# Patient Record
Sex: Female | Born: 1965 | Race: White | Hispanic: No | Marital: Married | Smoking: Current every day smoker
Health system: Southern US, Community
[De-identification: ages and names within clinical notes are randomized; demographics above are authoritative.]

## PROBLEM LIST (undated history)

## (undated) DIAGNOSIS — I10 Essential (primary) hypertension: Secondary | ICD-10-CM

## (undated) HISTORY — PX: APPENDECTOMY: SHX54

## (undated) HISTORY — PX: ABDOMINAL HYSTERECTOMY: SHX81

---

## 2020-01-15 ENCOUNTER — Other Ambulatory Visit: Payer: Self-pay

## 2020-01-15 ENCOUNTER — Encounter (HOSPITAL_BASED_OUTPATIENT_CLINIC_OR_DEPARTMENT_OTHER): Payer: Self-pay | Admitting: Emergency Medicine

## 2020-01-15 ENCOUNTER — Emergency Department (HOSPITAL_BASED_OUTPATIENT_CLINIC_OR_DEPARTMENT_OTHER)
Admission: EM | Admit: 2020-01-15 | Discharge: 2020-01-15 | Disposition: A | Discharge status: Home / Self Care | Payer: Federal, State, Local not specified - PPO | Attending: Emergency Medicine | Admitting: Emergency Medicine

## 2020-01-15 ENCOUNTER — Emergency Department (HOSPITAL_BASED_OUTPATIENT_CLINIC_OR_DEPARTMENT_OTHER): Payer: Federal, State, Local not specified - PPO

## 2020-01-15 DIAGNOSIS — W1830XA Fall on same level, unspecified, initial encounter: Secondary | ICD-10-CM | POA: Insufficient documentation

## 2020-01-15 DIAGNOSIS — W19XXXA Unspecified fall, initial encounter: Secondary | ICD-10-CM

## 2020-01-15 DIAGNOSIS — I10 Essential (primary) hypertension: Secondary | ICD-10-CM | POA: Insufficient documentation

## 2020-01-15 DIAGNOSIS — S52502A Unspecified fracture of the lower end of left radius, initial encounter for closed fracture: Secondary | ICD-10-CM | POA: Diagnosis not present

## 2020-01-15 DIAGNOSIS — F17210 Nicotine dependence, cigarettes, uncomplicated: Secondary | ICD-10-CM | POA: Diagnosis not present

## 2020-01-15 DIAGNOSIS — S6992XA Unspecified injury of left wrist, hand and finger(s), initial encounter: Secondary | ICD-10-CM | POA: Diagnosis present

## 2020-01-15 HISTORY — DX: Essential (primary) hypertension: I10

## 2020-01-15 MED ORDER — BUPIVACAINE HCL 0.25 % IJ SOLN
10.0000 mL | Freq: Once | INTRAMUSCULAR | Status: DC
  Filled 2020-01-15: qty 10

## 2020-01-15 MED ORDER — TRAMADOL HCL 50 MG PO TABS: 50.0000 mg | ORAL_TABLET | Freq: Four times a day (QID) | ORAL | 0 refills | Status: AC | PRN

## 2020-01-15 MED ORDER — BUPIVACAINE HCL 0.5 % IJ SOLN: 15.0000 mL | Freq: Once | INTRAMUSCULAR | Status: AC

## 2020-01-15 MED ORDER — BUPIVACAINE HCL 0.5 % IJ SOLN
INTRAMUSCULAR | Status: AC
  Administered 2020-01-15: 15 mL
  Filled 2020-01-15: qty 1

## 2020-01-15 MED ORDER — ONDANSETRON 4 MG PO TBDP: 4.0000 mg | ORAL_TABLET | Freq: Three times a day (TID) | ORAL | 0 refills | Status: AC | PRN

## 2020-01-15 MED ORDER — IBUPROFEN 800 MG PO TABS: 800.0000 mg | ORAL_TABLET | Freq: Three times a day (TID) | ORAL | 0 refills | Status: AC | PRN

## 2020-01-15 MED ORDER — KETOROLAC TROMETHAMINE 30 MG/ML IJ SOLN
30.0000 mg | Freq: Once | INTRAMUSCULAR | Status: AC
  Administered 2020-01-15: 30 mg via INTRAMUSCULAR
  Filled 2020-01-15: qty 1

## 2020-01-15 NOTE — ED Triage Notes (Signed)
Pt states she was standing on a bed trying to turn off the light and fell landing on her left arm  Pt states she broke her left wrist

## 2020-01-15 NOTE — ED Provider Notes (Signed)
Emergency Department Provider Note   I have reviewed the triage vital signs and the nursing notes.   HISTORY  Chief Complaint Arm Injury   HPI Krista Delacruz is a 54 y.o. female with PMH of HTN presents to the emergency department for evaluation after fall with left wrist pain and deformity.  Patient was trying to turn on nothing by standing up on the bed.  She lost her footing and fell landing primarily on her left arm.  No head injury or loss of consciousness.  She is noting pain and swelling to the arm but no laceration or bleeding.  Denies pain in her elbow or shoulder.  No neck or back discomfort.  No pain in the legs.  No head injury or loss of consciousness. No hand/arm numbness.   Past Medical History:  Diagnosis Date  . Hypertension     There are no problems to display for this patient.   Past Surgical History:  Procedure Laterality Date  . ABDOMINAL HYSTERECTOMY    . APPENDECTOMY      Allergies Patient has no known allergies.  Family History  Problem Relation Age of Onset  . Hypertension Other   . Stroke Other   . Heart attack Other     Social History Social History   Tobacco Use  . Smoking status: Current Every Day Smoker    Packs/day: 0.25    Types: Cigarettes  . Smokeless tobacco: Never Used  Vaping Use  . Vaping Use: Never used  Substance Use Topics  . Alcohol use: Yes    Comment: occ  . Drug use: Never    Review of Systems  Constitutional: No fever/chills Eyes: No visual changes. ENT: No sore throat. Cardiovascular: Denies chest pain. Respiratory: Denies shortness of breath. Gastrointestinal: No abdominal pain.  No nausea, no vomiting.  No diarrhea.  No constipation. Genitourinary: Negative for dysuria. Musculoskeletal: Positive left wrist pain/deformity.  Skin: Negative for rash. Neurological: Negative for headaches, focal weakness or numbness.  10-point ROS otherwise  negative.  ____________________________________________   PHYSICAL EXAM:  VITAL SIGNS: ED Triage Vitals  Enc Vitals Group     BP 01/15/20 0207 108/71     Pulse Rate 01/15/20 0207 84     Resp 01/15/20 0207 16     Temp 01/15/20 0207 98.2 F (36.8 C)     Temp Source 01/15/20 0207 Oral     SpO2 01/15/20 0207 99 %     Weight 01/15/20 0203 155 lb (70.3 kg)     Height 01/15/20 0203 5\' 4"  (1.626 m)   Constitutional: Alert and oriented. Well appearing and in no acute distress. Eyes: Conjunctivae are normal.  Head: Atraumatic. Nose: No congestion/rhinnorhea. Mouth/Throat: Mucous membranes are moist.   Neck: No stridor.  No cervical spine tenderness to palpation. Cardiovascular: Normal rate, regular rhythm. Good peripheral circulation. Grossly normal heart sounds.   Respiratory: Normal respiratory effort.  No retractions. Lungs CTAB. Gastrointestinal: Soft and nontender. No distention.  Musculoskeletal: Patient's left forearm and wrist are in a makeshift splint which was removed.  There is bruising and some mild deformity at the distal radius but no laceration or abrasion to suspect open fracture.  No tenderness over the left elbow or shoulder.  Neurologic:  Normal speech and language. Normal sensation in the left hand/forearm.  Skin:  Skin is warm, dry and intact. No rash noted.  ____________________________________________  RADIOLOGY  DG Wrist Complete Left  Result Date: 01/15/2020 CLINICAL DATA:  Fall, left wrist pain EXAM: LEFT  WRIST - COMPLETE 3+ VIEW COMPARISON:  None. FINDINGS: Three view radiograph left wrist demonstrates a a comminuted fracture of the distal left radius. The dominant fracture plane extends transversely across the metaphyseal region with 1/2 shaft with dorsal displacement, moderate dorsal angulation, and impaction of the fracture fragments resulting in dorsal tilt of the a distal radial articular surface, shortening of the distal radius with resultant ulnarl  positive variance of approximately 8 mm, and loss of the normal radial inclination. Additionally, a longitudinal fracture plane extends into the lunate fossa from the dominant fracture plane resulting in articular incongruity involving the lunate fossa of the distal radial articular surface. Radiocarpal articulation is preserved. Associated fracture of the base of the ulnar styloid is identified with minimal displacement of the ulnar styloid. Extensive surrounding soft tissue swelling is noted. IMPRESSION: Comminuted fracture of the distal left radius with override, dorsal displacement and angulation of the distal fracture fragment. There is dorsal tilt, loss of normal radial inclination, and articular incongruity involving the distal radial articular surface. Associated fracture of the base of the ulnar styloid. Electronically Signed   By: Helyn Numbers MD   On: 01/15/2020 03:00    ____________________________________________   PROCEDURES  Procedure(s) performed:   Reduction of fracture  Date/Time: 01/21/2020 10:38 AM Performed by: Maia Plan, MD Authorized by: Maia Plan, MD  Consent: Written consent obtained. Risks and benefits: risks, benefits and alternatives were discussed Consent given by: patient Imaging studies: imaging studies available Required items: required blood products, implants, devices, and special equipment available Patient identity confirmed: verbally with patient and arm band Time out: Immediately prior to procedure a "time out" was called to verify the correct patient, procedure, equipment, support staff and site/side marked as required. Preparation: Patient was prepped and draped in the usual sterile fashion. Local anesthesia used: yes Anesthesia: hematoma block  Anesthesia: Local anesthesia used: yes Local Anesthetic: bupivacaine 0.5% without epinephrine Anesthetic total: 6 mL  Sedation: Patient sedated: no  Patient tolerance: patient tolerated the  procedure well with no immediate complications Comments: After patient's hematoma block and anesthesia to the area achieved the left wrist and hand were distracted and elongated back to length with improved gross appearance. Wrist held in place for splinting.   Marland KitchenSplint Application  Date/Time: 01/21/2020 10:40 AM Performed by: Maia Plan, MD Authorized by: Maia Plan, MD   Consent:    Consent obtained:  Verbal   Consent given by:  Patient   Risks, benefits, and alternatives were discussed: yes     Risks discussed:  Discoloration, numbness, pain and swelling Universal protocol:    Patient identity confirmed:  Verbally with patient Pre-procedure details:    Distal neurologic exam:  Normal   Distal perfusion: distal pulses strong   Procedure details:    Location:  Wrist   Wrist location:  L wrist   Strapping: no     Cast type:  Short arm   Splint type:  Sugar tong   Supplies:  Cotton padding, fiberglass and sling   Attestation: Splint applied and adjusted personally by me   Post-procedure details:    Distal neurologic exam:  Unchanged   Distal perfusion: distal pulses strong     Procedure completion:  Tolerated   Post-procedure imaging: reviewed       ____________________________________________   INITIAL IMPRESSION / ASSESSMENT AND PLAN / ED COURSE  Pertinent labs & imaging results that were available during my care of the patient were reviewed by me and considered  in my medical decision making (see chart for details).   Patient presents to the emergency department with fall and resulting distal radius fracture.  No sign of head injury or midline spine tenderness to prompt additional imaging.  No evidence of open fracture.  After discussion of the imaging patient consented to hematoma block with reduction and splinting.  Patient will need to remain in the splint and follow with hand surgery.  She tolerated the procedure well as described above.  Patient is returning to  [place] and will not be following up locally.  I did provide the contact information for our local hand surgeon on call in case she ends up staying in the area but will call her orthopedic surgeon when she returns to [place] in the next several days.  Discussed management of swelling and pain at home.  Medications provided at discharge to treat pain after [facility] drug database review. Discussed ED return precautions.    ____________________________________________  FINAL CLINICAL IMPRESSION(S) / ED DIAGNOSES  Final diagnoses:  Fall, initial encounter  Closed fracture of distal end of left radius, unspecified fracture morphology, initial encounter     MEDICATIONS GIVEN DURING THIS VISIT:  Medications  ketorolac (TORADOL) 30 MG/ML injection 30 mg (30 mg Intramuscular Given 01/15/20 0315)  bupivacaine (MARCAINE) 0.5 % (with pres) injection 15 mL (15 mLs Infiltration Given 01/15/20 0559)     NEW OUTPATIENT MEDICATIONS STARTED DURING THIS VISIT:  Discharge Medication List as of 01/15/2020  5:37 AM    START taking these medications   Details  ibuprofen (ADVIL) 800 MG tablet Take 1 tablet (800 mg total) by mouth every 8 (eight) hours as needed for moderate pain., Starting Wed 01/15/2020, Normal    ondansetron (ZOFRAN ODT) 4 MG disintegrating tablet Take 1 tablet (4 mg total) by mouth every 8 (eight) hours as needed., Starting Wed 01/15/2020, Normal    traMADol (ULTRAM) 50 MG tablet Take 1 tablet (50 mg total) by mouth every 6 (six) hours as needed for severe pain., Starting Wed 01/15/2020, Normal        Note:  This document was prepared using Dragon voice recognition software and may include unintentional dictation errors.  Alona Bene, MD, [facility] Emergency Medicine    Tatianna Ibbotson, Arlyss Repress, MD 01/21/20 [phone]

## 2020-01-15 NOTE — Discharge Instructions (Signed)
You were seen in the emerge department today with arm injury after a fall.  We were able to reduce your fracture and placed you in a splint.  This needs to stay clean and dry.  Please elevate this above the level of your heart and apply ice to reduce swelling.  You may take the pain medications provided as directed.  Tramadol can cause drowsiness and impair your ability to drive a car.  Do not take this with alcohol.  I have listed the name of the local orthopedic surgeon to follow-up with in the coming week.  Please call if you will be staying in town longer than expected.  Once you return home, please contact an orthopedic surgeon in your area for follow-up as this will likely require ongoing specialist care and possible surgery.

## 2022-03-17 IMAGING — DX DG WRIST COMPLETE 3+V*L*
3 series · 3 of 3 positions shown · non-contrast
Comparison: None.

CLINICAL DATA: Fall, left wrist pain

EXAM:
LEFT WRIST - COMPLETE 3+ VIEW

[wrist ap]
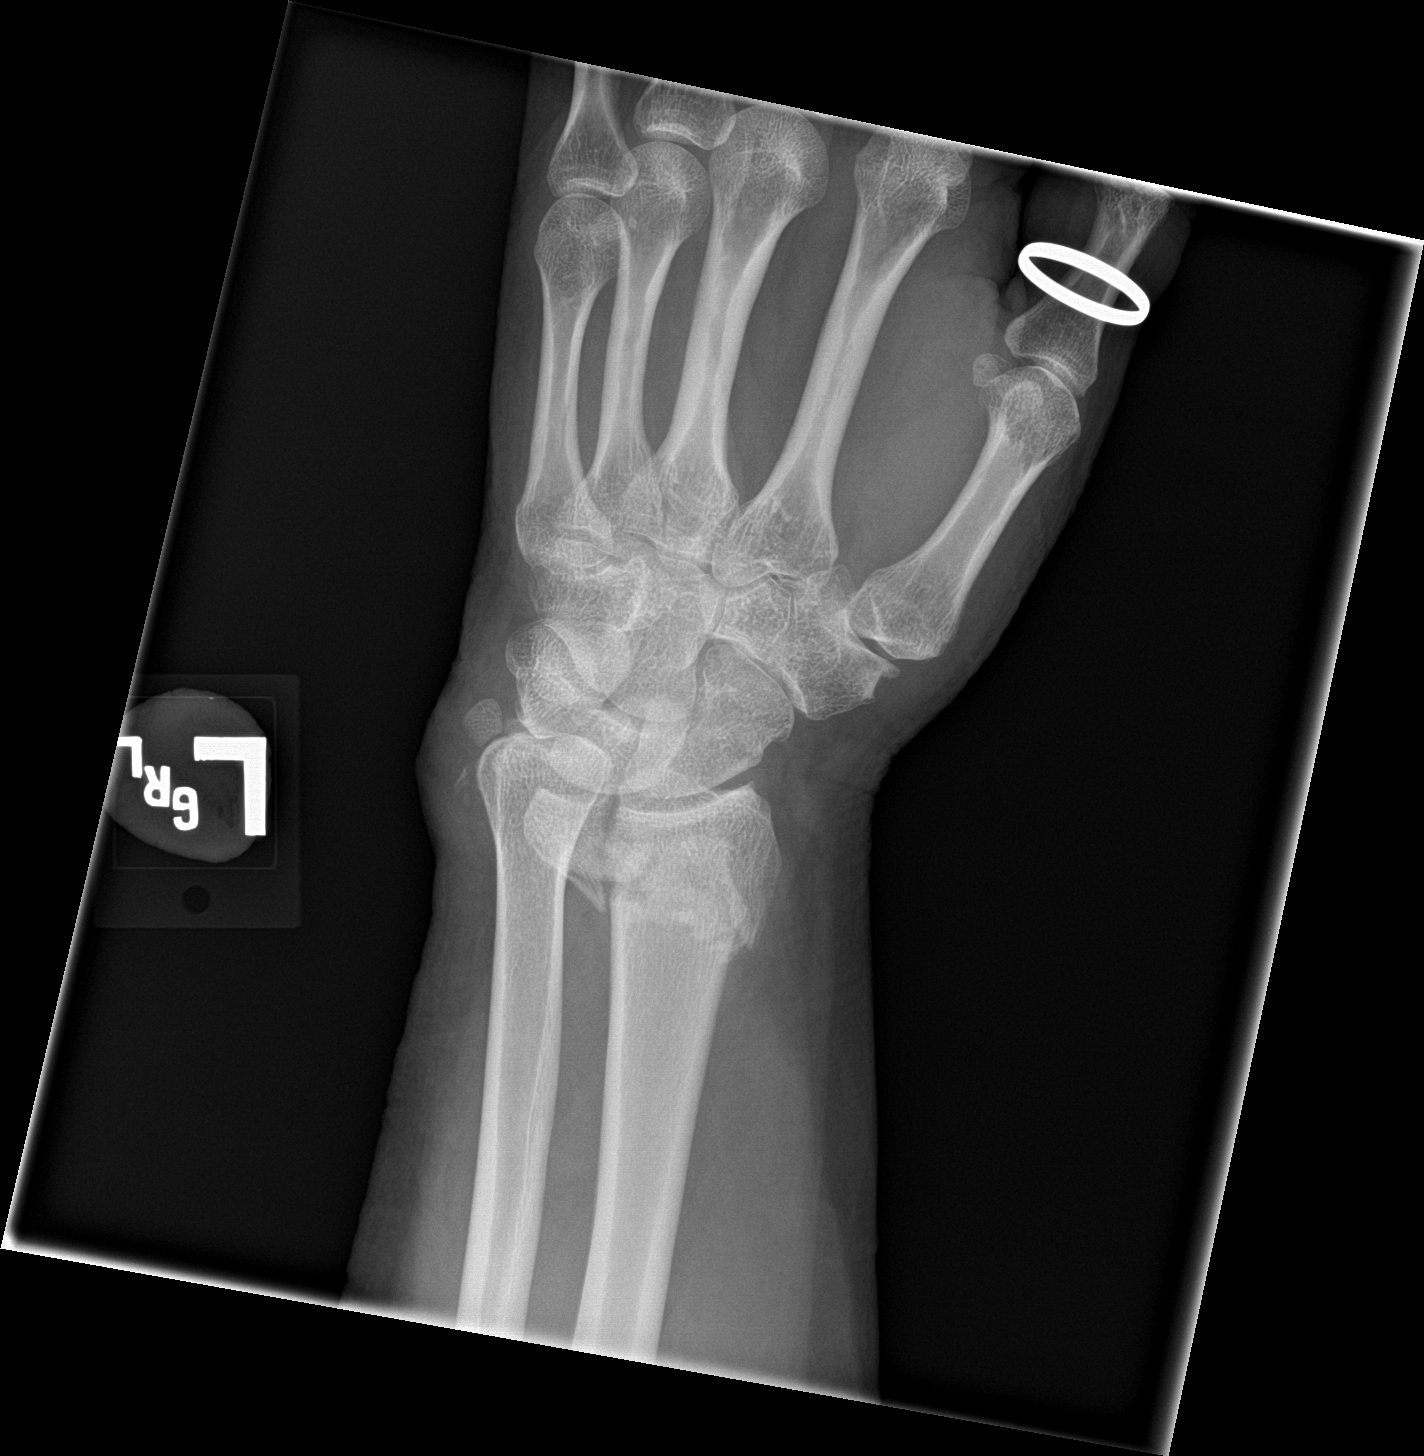

[wrist obl]
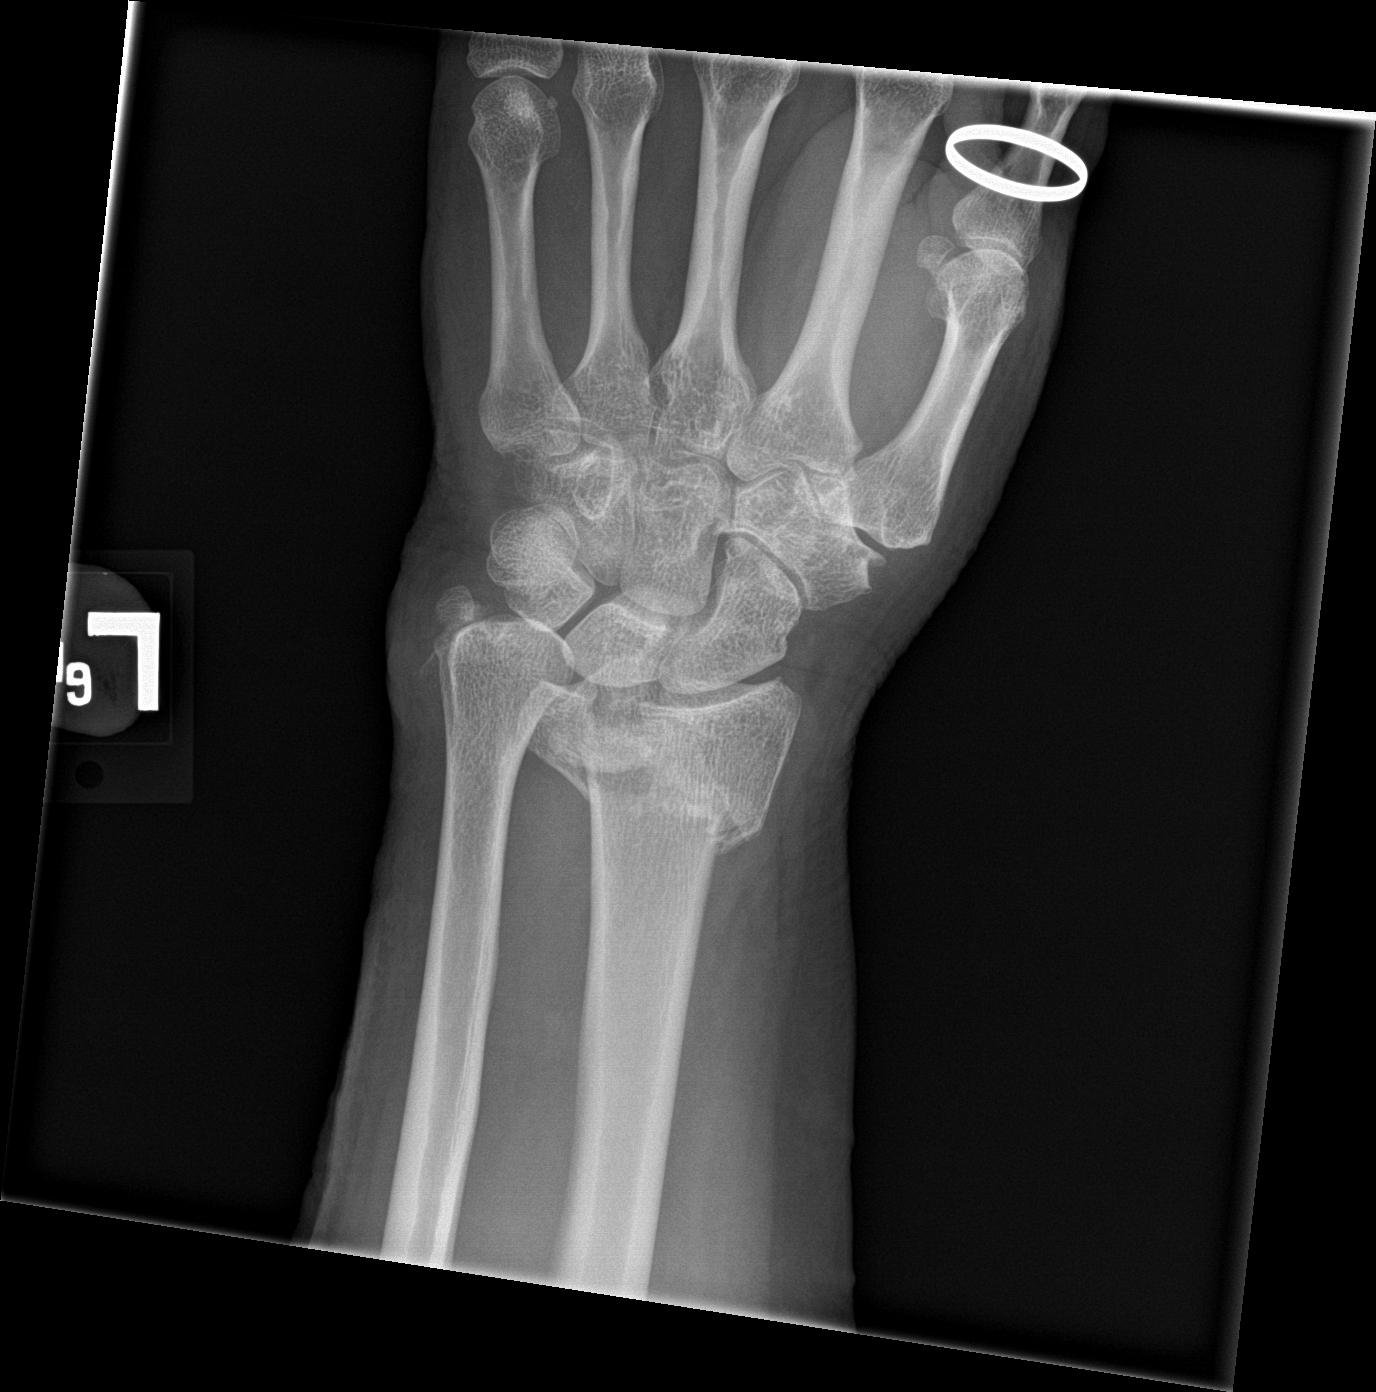

[wrist lat]
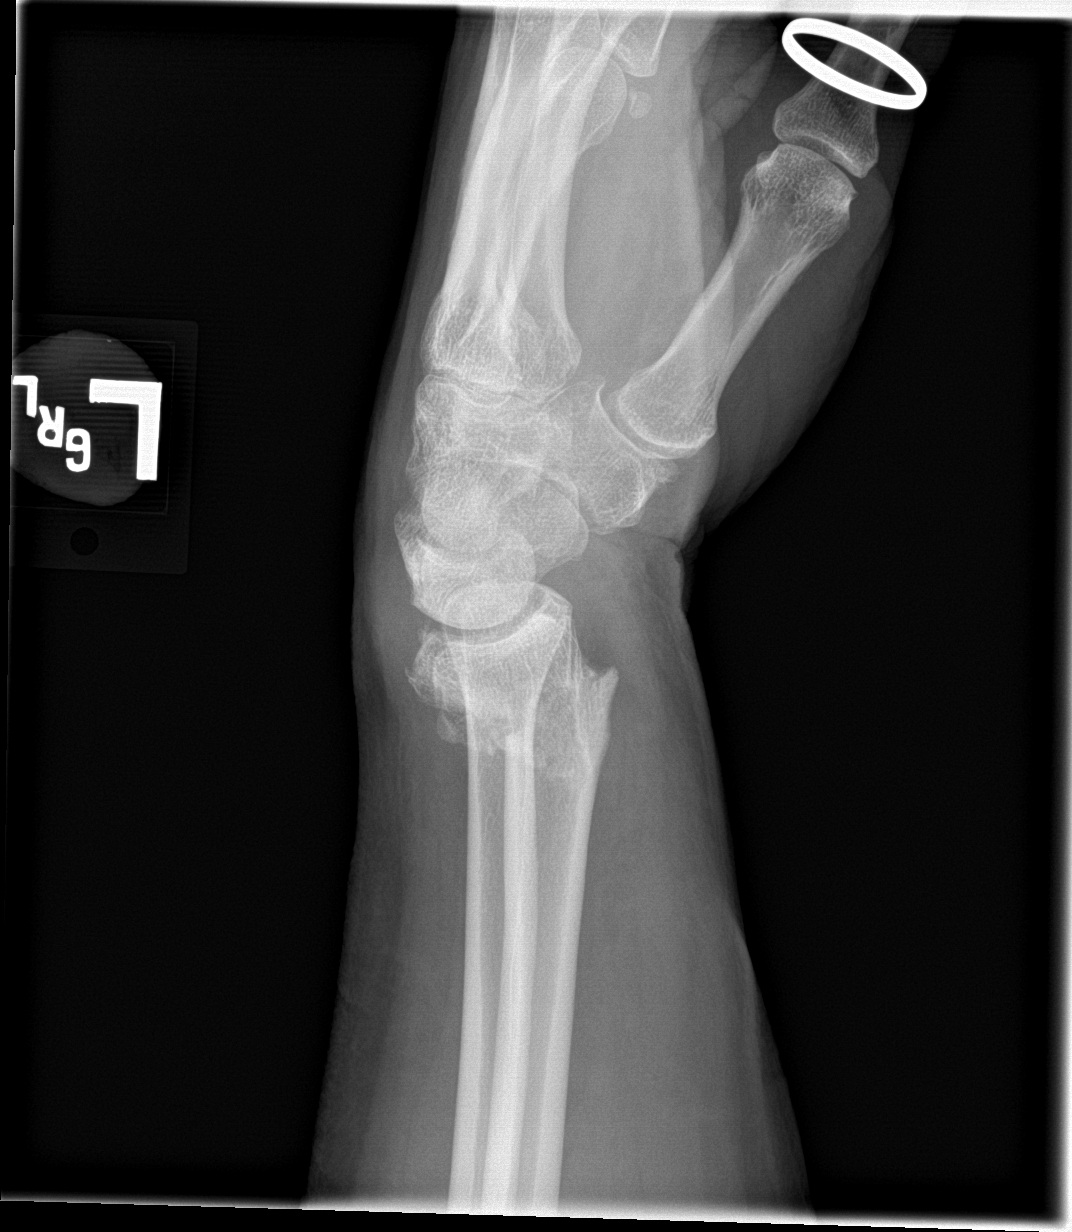

[3 of 3 positions shown; findings below may reference images not displayed]

FINDINGS: Three view radiograph left wrist demonstrates a a comminuted
fracture of the distal left radius. The dominant fracture plane
extends transversely across the metaphyseal region with [DATE] shaft
with dorsal displacement, moderate dorsal angulation, and impaction
of the fracture fragments resulting in dorsal tilt of the a distal
radial articular surface, shortening of the distal radius with
resultant ulnarl positive variance of approximately 8 mm, and loss
of the normal radial inclination. Additionally, a longitudinal
fracture plane extends into the lunate fossa from the dominant
fracture plane resulting in articular incongruity involving the
lunate fossa of the distal radial articular surface. Radiocarpal
articulation is preserved.

Associated fracture of the base of the ulnar styloid is identified
with minimal displacement of the ulnar styloid. Extensive
surrounding soft tissue swelling is noted.
IMPRESSION: Comminuted fracture of the distal left radius with override, dorsal
displacement and angulation of the distal fracture fragment. There
is dorsal tilt, loss of normal radial inclination, and articular
incongruity involving the distal radial articular surface.

Associated fracture of the base of the ulnar styloid.

## 2022-03-17 IMAGING — DX DG WRIST COMPLETE 3+V*L*
2 series · 2 of 2 positions shown · non-contrast
Comparison: 0661

CLINICAL DATA: Status post left wrist reduction

EXAM:
LEFT WRIST - COMPLETE 3+ VIEW

[wrist ap (1 of 2)]
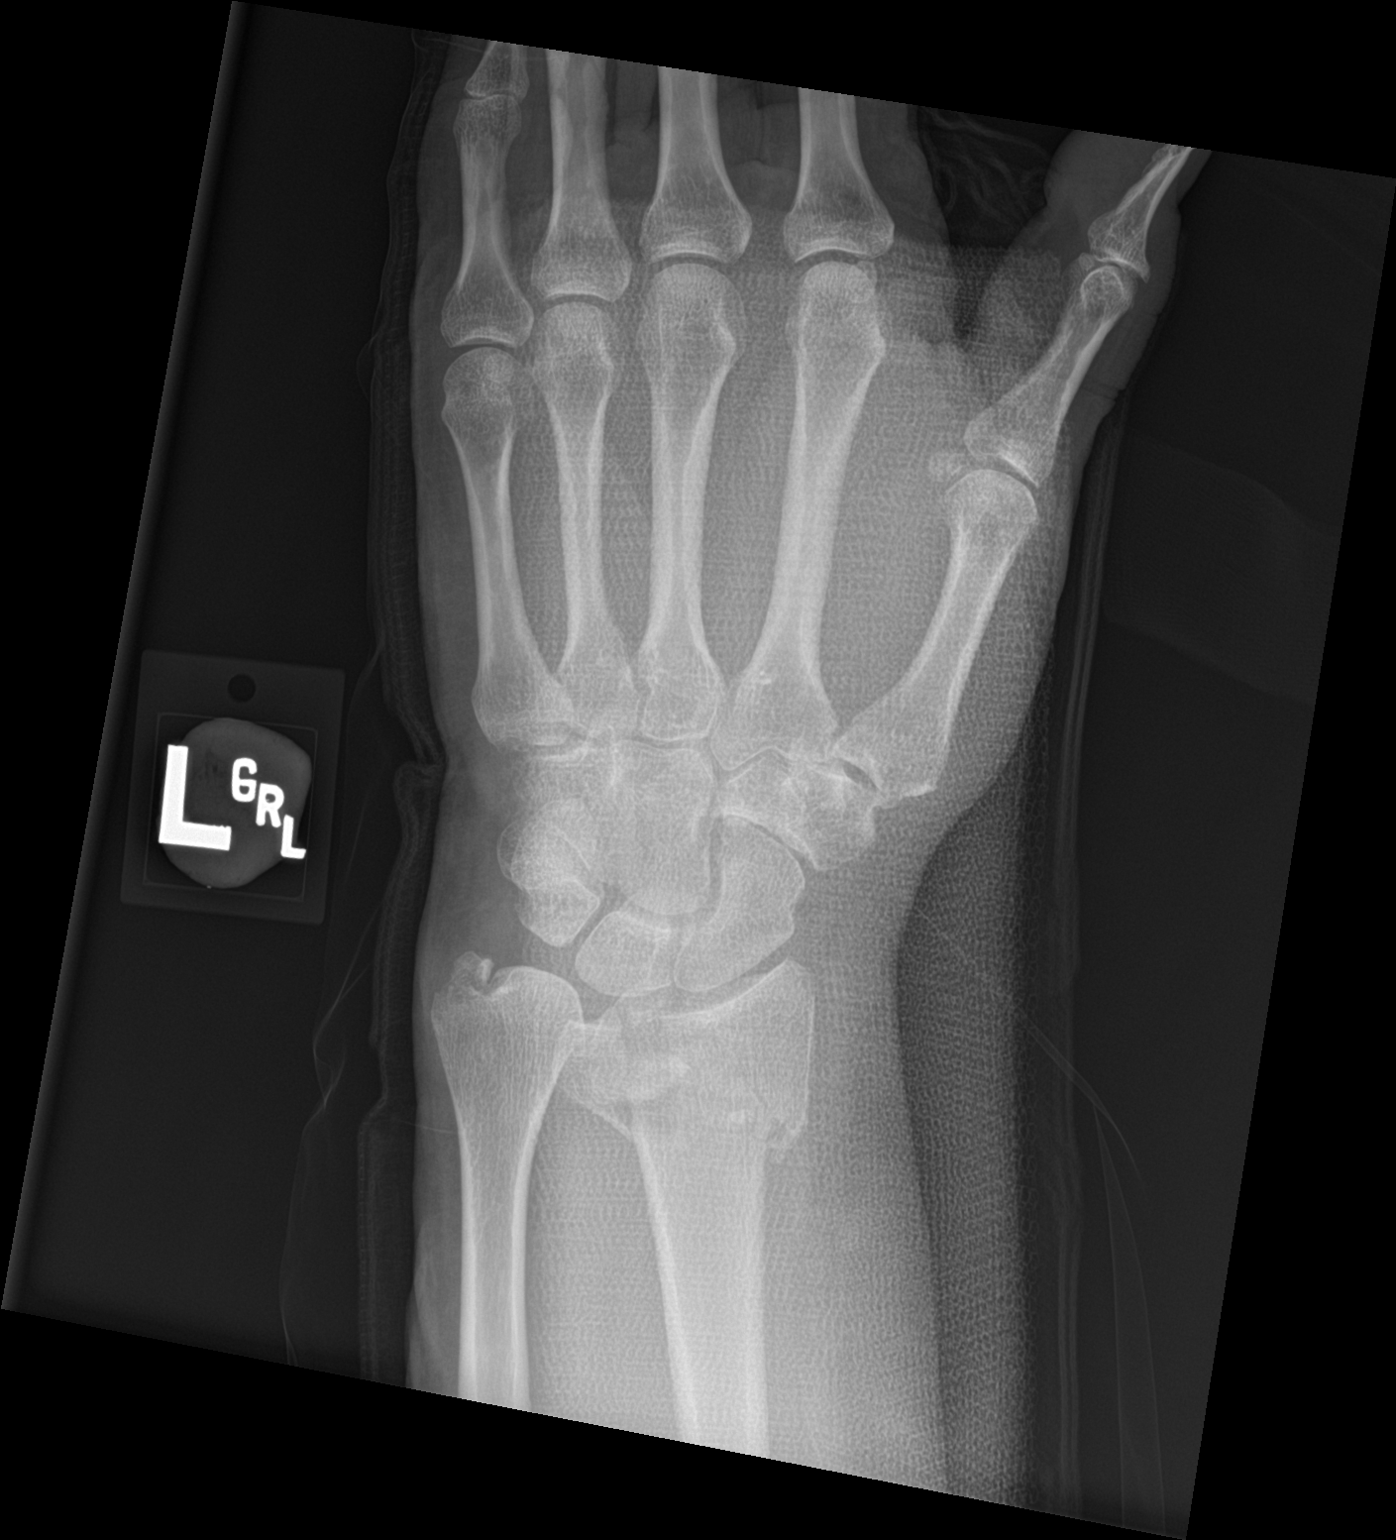

[wrist ap (2 of 2)]
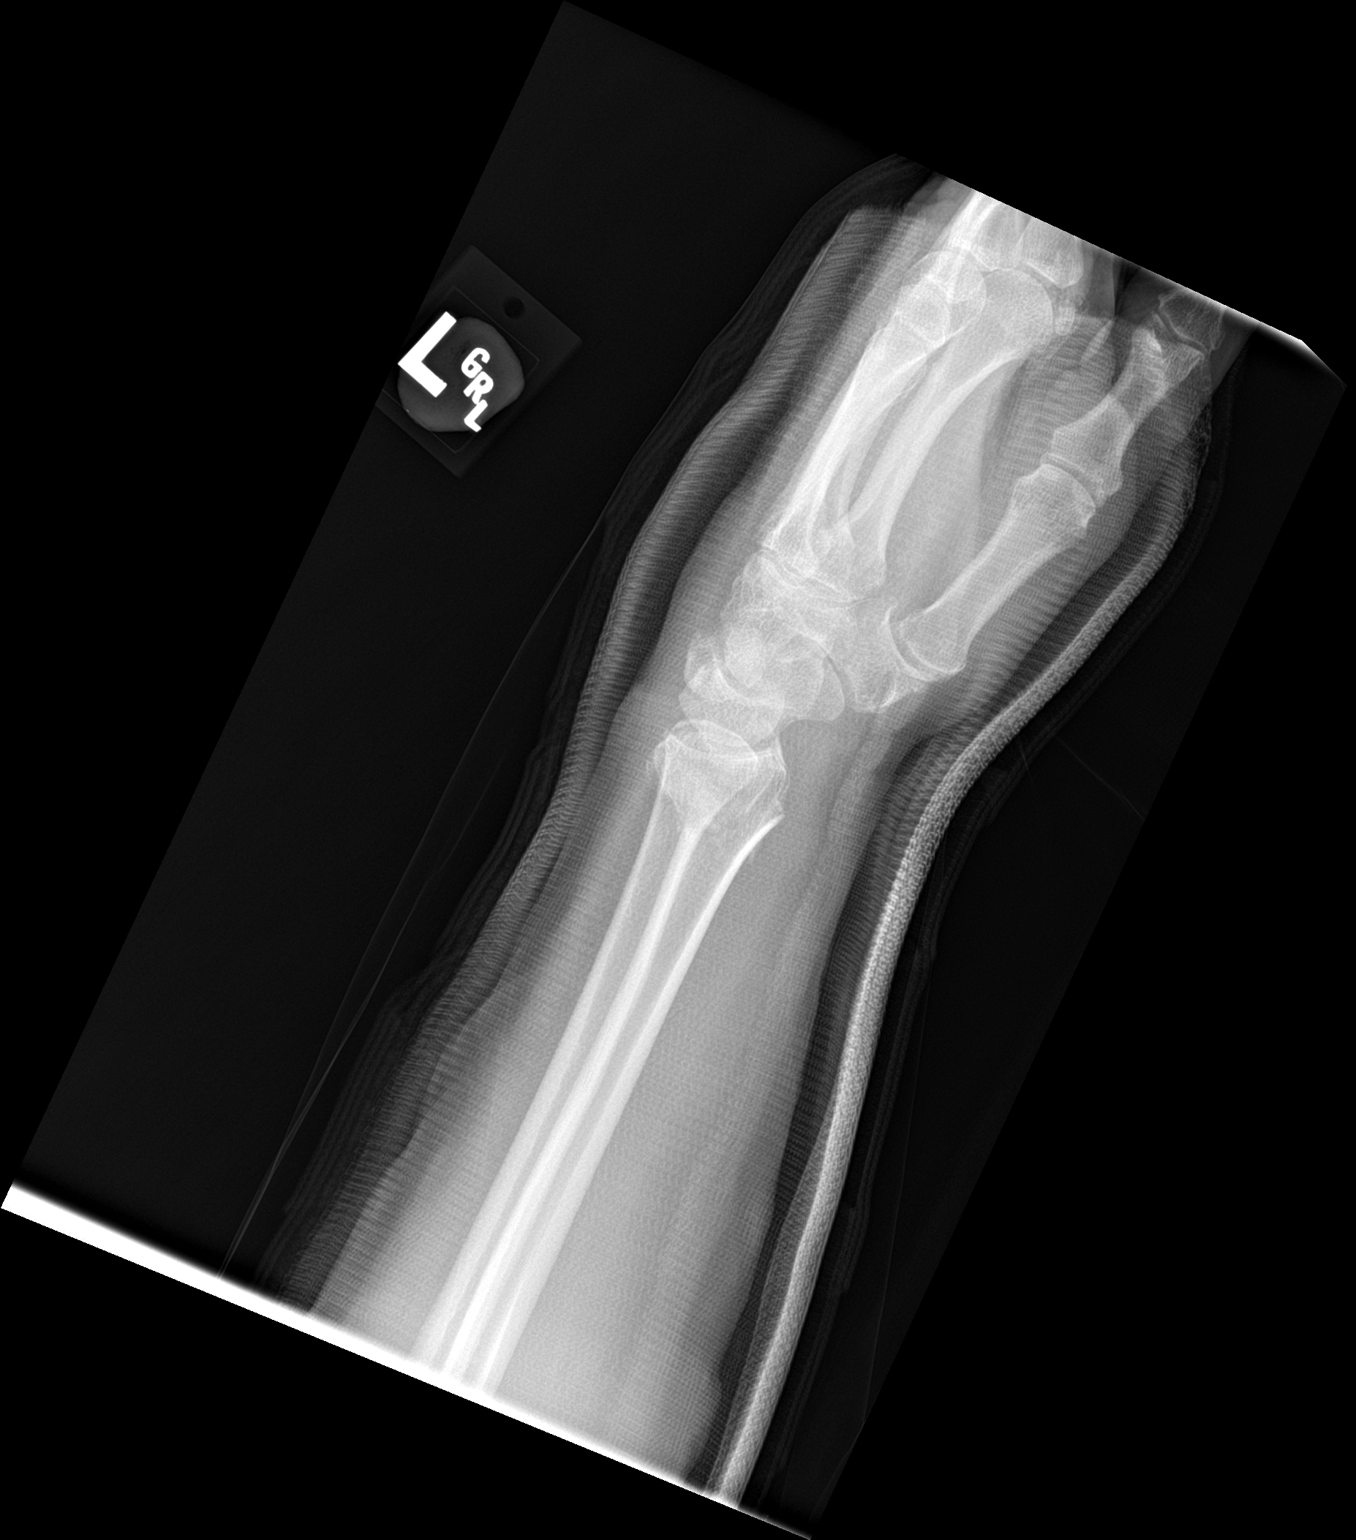

[2 of 2 positions shown; findings below may reference images not displayed]

FINDINGS: Comminuted fracture of the distal left radius is again identified
now within a a external immobilizer. There has been partial
reduction of fracture fragment override and resultant ulnar positive
variance, though 5 mm ulnar positive variance persists. Interval
partial reduction of dorsal displacement and angulation of the
distal radial fracture fragment though dorsal tilt of the distal
radial articular surface persists. Mild improvement in radial
inclination. Persistent articular incongruity involving the distal
radial articular surface with distraction of the a major and
medial-lateral fracture fragments of the distal radial articular
surface.
IMPRESSION: Interval partial left distal radial fracture reduction as described
above.
